# Patient Record
Sex: Female | Born: 1975 | Hispanic: No | Marital: Married | Smoking: Never smoker
Health system: Southern US, Community
[De-identification: ages and names within clinical notes are randomized; demographics above are authoritative.]

## PROBLEM LIST (undated history)

## (undated) DIAGNOSIS — E785 Hyperlipidemia, unspecified: Secondary | ICD-10-CM

## (undated) DIAGNOSIS — I10 Essential (primary) hypertension: Secondary | ICD-10-CM

## (undated) HISTORY — PX: TUBAL LIGATION: SHX77

---

## 2020-06-02 ENCOUNTER — Encounter (HOSPITAL_BASED_OUTPATIENT_CLINIC_OR_DEPARTMENT_OTHER): Payer: Self-pay | Admitting: *Deleted

## 2020-06-02 ENCOUNTER — Emergency Department (HOSPITAL_BASED_OUTPATIENT_CLINIC_OR_DEPARTMENT_OTHER): Payer: BC Managed Care – PPO

## 2020-06-02 ENCOUNTER — Emergency Department (HOSPITAL_BASED_OUTPATIENT_CLINIC_OR_DEPARTMENT_OTHER)
Admission: EM | Admit: 2020-06-02 | Discharge: 2020-06-02 | Disposition: A | Discharge status: Home / Self Care | Payer: BC Managed Care – PPO | Attending: Emergency Medicine | Admitting: Emergency Medicine

## 2020-06-02 ENCOUNTER — Other Ambulatory Visit: Payer: Self-pay

## 2020-06-02 DIAGNOSIS — R609 Edema, unspecified: Secondary | ICD-10-CM | POA: Insufficient documentation

## 2020-06-02 DIAGNOSIS — I10 Essential (primary) hypertension: Secondary | ICD-10-CM | POA: Diagnosis not present

## 2020-06-02 DIAGNOSIS — D259 Leiomyoma of uterus, unspecified: Secondary | ICD-10-CM | POA: Insufficient documentation

## 2020-06-02 DIAGNOSIS — R300 Dysuria: Secondary | ICD-10-CM | POA: Insufficient documentation

## 2020-06-02 DIAGNOSIS — R102 Pelvic and perineal pain: Secondary | ICD-10-CM

## 2020-06-02 HISTORY — DX: Essential (primary) hypertension: I10

## 2020-06-02 HISTORY — DX: Hyperlipidemia, unspecified: E78.5

## 2020-06-02 LAB — CBC WITH DIFFERENTIAL/PLATELET
Abs Immature Granulocytes: 0.01 10*3/uL (ref 0.00–0.07)
Basophils Absolute: 0 10*3/uL (ref 0.0–0.1)
Basophils Relative: 0 %
Eosinophils Absolute: 0.1 10*3/uL (ref 0.0–0.5)
Eosinophils Relative: 1 %
HCT: 43 % (ref 36.0–46.0)
Hemoglobin: 14.1 g/dL (ref 12.0–15.0)
Immature Granulocytes: 0 %
Lymphocytes Relative: 28 %
Lymphs Abs: 1.4 10*3/uL (ref 0.7–4.0)
MCH: 29.4 pg (ref 26.0–34.0)
MCHC: 32.8 g/dL (ref 30.0–36.0)
MCV: 89.8 fL (ref 80.0–100.0)
Monocytes Absolute: 0.5 10*3/uL (ref 0.1–1.0)
Monocytes Relative: 10 %
Neutro Abs: 2.9 10*3/uL (ref 1.7–7.7)
Neutrophils Relative %: 61 %
Platelets: 194 10*3/uL (ref 150–400)
RBC: 4.79 MIL/uL (ref 3.87–5.11)
RDW: 13.8 % (ref 11.5–15.5)
WBC: 4.8 10*3/uL (ref 4.0–10.5)
nRBC: 0 % (ref 0.0–0.2)

## 2020-06-02 LAB — URINALYSIS, ROUTINE W REFLEX MICROSCOPIC
Bilirubin Urine: NEGATIVE
Glucose, UA: NEGATIVE mg/dL
Hgb urine dipstick: NEGATIVE
Ketones, ur: NEGATIVE mg/dL
Leukocytes,Ua: NEGATIVE
Nitrite: NEGATIVE
Protein, ur: NEGATIVE mg/dL
Specific Gravity, Urine: 1.01 (ref 1.005–1.030)
pH: 6.5 (ref 5.0–8.0)

## 2020-06-02 LAB — COMPREHENSIVE METABOLIC PANEL
ALT: 25 U/L (ref 0–44)
AST: 24 U/L (ref 15–41)
Albumin: 4 g/dL (ref 3.5–5.0)
Alkaline Phosphatase: 51 U/L (ref 38–126)
Anion gap: 9 (ref 5–15)
BUN: 9 mg/dL (ref 6–20)
CO2: 25 mmol/L (ref 22–32)
Calcium: 9 mg/dL (ref 8.9–10.3)
Chloride: 103 mmol/L (ref 98–111)
Creatinine, Ser: 0.74 mg/dL (ref 0.44–1.00)
GFR, Estimated: >60 mL/min (ref >60)
Glucose, Bld: 77 mg/dL (ref 70–99)
Potassium: 3.4 mmol/L — ABNORMAL LOW (ref 3.5–5.1)
Sodium: 137 mmol/L (ref 135–145)
Total Bilirubin: 0.6 mg/dL (ref 0.3–1.2)
Total Protein: 7 g/dL (ref 6.5–8.1)

## 2020-06-02 LAB — WET PREP, GENITAL
Sperm: NONE SEEN
Trich, Wet Prep: NONE SEEN
WBC, Wet Prep HPF POC: NONE SEEN
Yeast Wet Prep HPF POC: NONE SEEN

## 2020-06-02 LAB — PREGNANCY, URINE: Preg Test, Ur: NEGATIVE

## 2020-06-02 LAB — BRAIN NATRIURETIC PEPTIDE: B Natriuretic Peptide: 11.6 pg/mL (ref 0.0–100.0)

## 2020-06-02 LAB — LIPASE, BLOOD: Lipase: 40 U/L (ref 11–51)

## 2020-06-02 MED ORDER — POTASSIUM CHLORIDE CRYS ER 20 MEQ PO TBCR
40.0000 meq | EXTENDED_RELEASE_TABLET | Freq: Once | ORAL | Status: AC
  Administered 2020-06-02: 40 meq via ORAL
  Filled 2020-06-02: qty 2

## 2020-06-02 NOTE — ED Notes (Signed)
Pt to US.

## 2020-06-02 NOTE — ED Provider Notes (Signed)
[facility] DEPART[facility] Provider Note   CSN: [id number] Arrival date & time: 06/02/20  1359     History Chief Complaint  Patient presents with  . Dysuria    Cindy Barrett is a 45 y.o. female with a past medical history significant for hyperlipidemia and hypertension presents to the ED due to multiple complaints of dysuria, pelvic pain, lower extremity edema.  Patient states dysuria and pelvic pain have been present for the past month.  She describes pain as a pressure and burning sensation that occasionally radiates to the upper portion of her abdomen.  Denies fever and chills.  Denies associated nausea, vomiting, diarrhea.  Patient has been evaluated by her PCP and has a pelvic and abdominal ultrasound scheduled on the 27th for further evaluation.  Patient is currently sexually active with her husband with no concern for STDs.  Denies increased vaginal discharge. She has had 3 cesarean sections, but no other abdominal operations. No treatment prior to arrival.  Aggravating or alleviating symptoms.  Patient also admits to bilateral lower extremity edema that has been present for few months.  Per chart review.  Patient had a recent echocardiogram which was noncontributory per PCP note. Peripheral edema unknown cause at this time; however, is still being worked up by PCP. Denies associated chest pain and shortness of breath.  Denies history of blood clots, recent surgeries, recent long immobilizations, and hormonal treatments.  History obtained from patient and past medical records. No interpreter used during encounter.      Past Medical History:  Diagnosis Date  . Hyperlipidemia   . Hypertension     There are no problems to display for this patient.   Past Surgical History:  Procedure Laterality Date  . CESAREAN SECTION    . TUBAL LIGATION       OB History   No obstetric history on file.     No family history on file.  Social History   Substance Use  Topics  . Alcohol use: Not Currently  . Drug use: Not Currently    Home Medications Prior to Admission medications   Not on File    Allergies    Escitalopram oxalate  Review of Systems   Review of Systems  Constitutional: Negative for chills and fever.  Respiratory: Negative for shortness of breath.   Cardiovascular: Positive for leg swelling (around ankles/feet). Negative for chest pain.  Gastrointestinal: Positive for abdominal pain. Negative for diarrhea, nausea and vomiting.  Genitourinary: Positive for dysuria and pelvic pain. Negative for vaginal discharge.  All other systems reviewed and are negative.   Physical Exam Updated Vital Signs BP (!) 115/48 (BP Location: Left Arm)   Pulse 61   Temp 98.5 F (36.9 C) (Oral)   Resp 20   Ht 5\' 4"  (1.626 m)   Wt 113.9 kg   LMP 04/29/2020   SpO2 100%   BMI 43.08 kg/m   Physical Exam Vitals and nursing note reviewed.  Constitutional:      General: She is not in acute distress.    Appearance: She is not ill-appearing.  HENT:     Head: Normocephalic.  Eyes:     Pupils: Pupils are equal, round, and reactive to light.  Cardiovascular:     Rate and Rhythm: Normal rate and regular rhythm.     Pulses: Normal pulses.     Heart sounds: Normal heart sounds. No murmur heard. No friction rub. No gallop.   Pulmonary:     Effort: Pulmonary effort  is normal.     Breath sounds: Normal breath sounds.  Abdominal:     General: Abdomen is flat. There is no distension.     Palpations: Abdomen is soft.     Tenderness: There is no abdominal tenderness. There is no guarding or rebound.     Comments: Mild suprapubic tenderness without rebound or guarding  Genitourinary:    Comments: Exam performed with chaperone in room.  Mild amount of white vaginal discharge.  No CMT.  No adnexal tenderness or masses. Musculoskeletal:        General: Normal range of motion.     Cervical back: Neck supple.  Skin:    General: Skin is warm and dry.      Comments: Edema to bilateral ankles and feet. No calf tenderness. Negative homan sign bilaterally.   Neurological:     General: No focal deficit present.     Mental Status: She is alert.  Psychiatric:        Mood and Affect: Mood normal.        Behavior: Behavior normal.     ED Results / Procedures / Treatments   Labs (all labs ordered are listed, but only abnormal results are displayed) Labs Reviewed  WET PREP, GENITAL - Abnormal; Notable for the following components:      Result Value   Clue Cells Wet Prep HPF POC PRESENT (*)    All other components within normal limits  COMPREHENSIVE METABOLIC PANEL - Abnormal; Notable for the following components:   Potassium 3.4 (*)    All other components within normal limits  URINALYSIS, ROUTINE W REFLEX MICROSCOPIC  PREGNANCY, URINE  CBC WITH DIFFERENTIAL/PLATELET  LIPASE, BLOOD  BRAIN NATRIURETIC PEPTIDE  GC/CHLAMYDIA PROBE AMP ([place]) NOT AT [facility]    EKG None  Radiology US PELVIC COMPLETE W TRANSVAGINAL AND TORSION R/O  Result Date: 06/02/2020 CLINICAL DATA:  Pelvic pain EXAM: TRANSABDOMINAL AND TRANSVAGINAL ULTRASOUND OF PELVIS DOPPLER ULTRASOUND OF OVARIES TECHNIQUE: Both transabdominal and transvaginal ultrasound examinations of the pelvis were performed. Transabdominal technique was performed for global imaging of the pelvis including uterus, ovaries, adnexal regions, and pelvic cul-de-sac. It was necessary to proceed with endovaginal exam following the transabdominal exam to visualize the uterus, endometrium, and ovaries. Color and duplex Doppler ultrasound was utilized to evaluate blood flow to the ovaries. COMPARISON:  Ultrasound pelvis 08/30/2015 FINDINGS: Measurements: 8.7 x 4.6 x 5.4 cm = volume: 111 mL. Two fibroids are noted within the posterior uterine body/fundus measuring 2.0 x 1.7 x 1.7 cm and 1.8 x 1.6 x 1.7 cm cm. Endometrium Thickness: 7 mm.  No focal abnormality visualized. Right ovary Measurements: 3.9 x 2.5 x  3.7 cm = volume: 18 mL. Normal appearance/no adnexal mass. Only visualized transabdominally. Left ovary Measurements: 3.3 x 1.8 x 2.7 cm = volume: 8 mL. Normal appearance/no adnexal mass. Only visualized transabdominally. Pulsed Doppler evaluation of both ovaries demonstrates normal low-resistance arterial and venous waveforms. Other findings No abnormal free fluid. IMPRESSION: Two small uterine fibroids. Otherwise unremarkable pelvic ultrasound. Electronically Signed   By: Miachel Roux M.D.   On: 06/02/2020 16:26    Procedures Procedures   Medications Ordered in ED Medications  potassium chloride SA (KLOR-CON) CR tablet 40 mEq (40 mEq Oral Given 06/02/20 1623)    ED Course  I have reviewed the triage vital signs and the nursing notes.  Pertinent labs & imaging results that were available during my care of the patient were reviewed by me and considered in my  medical decision making (see chart for details).  Clinical Course as of 06/02/20 1645  Fri Jun 02, 2020  1618 Clue Cells Wet Prep HPF POC(!): PRESENT [CA]  1618 B Natriuretic Peptide: 11.6 [CA]  1618 Potassium(!): 3.4 [CA]  1618 Preg Test, Ur: NEGATIVE [CA]    Clinical Course User Index [CA] Suzy Bouchard, PA-C   MDM Rules/Calculators/A&P                         45 year old female presents to the ED due to multiple complaints of dysuria, pelvic pain, and peripheral edema that been ongoing for a few months.  Patient has been worked up by PCP and has an abdominal and pelvic ultrasound scheduled on 5/27. Patient is afebrile and non-toxic appearing. Physical exam significant for mild suprapubic tenderness without rebound or guarding.  No tenderness at Mcburney's point. Negative murphy's sign. Low suspicion for acute abdomen. Routine labs ordered to check electrolytes and renal function given peripheral edema. BNP to rule out CHF. Pelvic ultrasound to rule out pelvic etiology.   Pelvic exam performed with chaperone in room.  Mild  amount of white discharge.  No CMT or adnexal tenderness/masses.  Doubt PID.  CBC unremarkable no leukocytosis and normal hemoglobin.  BMP normal.  Doubt CHF.  Lipase normal at 40.  CMP significant for hypokalemia at 3.4, but otherwise unremarkable.  Normal renal function.  Wet prep positive for clue cells.  Given patient is currently asymptomatic we will hold antibiotics at this time.  Pregnancy test negative.  UA unremarkable with no signs of infection. [place] personally reviewed which demonstrates: IMPRESSION:  Two small uterine fibroids. Otherwise unremarkable pelvic  ultrasound.   Suspect pelvic pressure related to fibroids. Patient has a schedule OBGYN appointment in 2 weeks. Instructed her to report to her scheduled appointment.  Patient instructed to take over-the-counter ibuprofen or Tylenol as needed for pain. Strict ED precautions discussed with patient. Patient states understanding and agrees to plan. Patient discharged home in no acute distress and stable vitals. Final Clinical Impression(s) / ED Diagnoses Final diagnoses:  Uterine leiomyoma, unspecified location  Dysuria    Rx / DC Orders ED Discharge Orders    None       Karie Kirks 06/02/20 1705    Isla Pence, MD 06/03/20 1137

## 2020-06-02 NOTE — ED Triage Notes (Signed)
C/o dysuria and pelvic pain x 1 month , sched for [place]  On 27th

## 2020-06-02 NOTE — Discharge Instructions (Addendum)
It was a pleasure taking care of you today. All of your labs were reassuring today. Your ultrasound showed 2 fibroids which are most likely causing your pelvic pain/pressure. Please follow-up with OBGYN for further evaluation.  You may take over-the-counter ibuprofen or Tylenol as needed for pain.  Still go to your ultrasound scheduled on the 27th for you abdominal ultrasound, but you do not need to repeat your pelvic ultrasound. Please follow-up with PCP if symptoms do not improved within the next week.  Return to the ER for new or worsening symptoms.

## 2020-06-05 LAB — GC/CHLAMYDIA PROBE AMP (~~LOC~~) NOT AT ARMC
Chlamydia: NEGATIVE
Comment: NEGATIVE
Comment: NORMAL
Neisseria Gonorrhea: NEGATIVE

## 2020-12-28 ENCOUNTER — Emergency Department (HOSPITAL_BASED_OUTPATIENT_CLINIC_OR_DEPARTMENT_OTHER): Payer: BC Managed Care – PPO

## 2020-12-28 ENCOUNTER — Emergency Department (HOSPITAL_BASED_OUTPATIENT_CLINIC_OR_DEPARTMENT_OTHER)
Admission: EM | Admit: 2020-12-28 | Discharge: 2020-12-28 | Disposition: A | Discharge status: Home / Self Care | Payer: BC Managed Care – PPO | Attending: Emergency Medicine | Admitting: Emergency Medicine

## 2020-12-28 ENCOUNTER — Other Ambulatory Visit: Payer: Self-pay

## 2020-12-28 ENCOUNTER — Encounter (HOSPITAL_BASED_OUTPATIENT_CLINIC_OR_DEPARTMENT_OTHER): Payer: Self-pay | Admitting: Urology

## 2020-12-28 DIAGNOSIS — M549 Dorsalgia, unspecified: Secondary | ICD-10-CM | POA: Insufficient documentation

## 2020-12-28 DIAGNOSIS — R0602 Shortness of breath: Secondary | ICD-10-CM | POA: Insufficient documentation

## 2020-12-28 DIAGNOSIS — R0789 Other chest pain: Secondary | ICD-10-CM | POA: Insufficient documentation

## 2020-12-28 DIAGNOSIS — R042 Hemoptysis: Secondary | ICD-10-CM | POA: Diagnosis not present

## 2020-12-28 DIAGNOSIS — R079 Chest pain, unspecified: Secondary | ICD-10-CM

## 2020-12-28 DIAGNOSIS — I10 Essential (primary) hypertension: Secondary | ICD-10-CM | POA: Diagnosis not present

## 2020-12-28 LAB — BASIC METABOLIC PANEL
Anion gap: 8 (ref 5–15)
BUN: 11 mg/dL (ref 6–20)
CO2: 25 mmol/L (ref 22–32)
Calcium: 9.3 mg/dL (ref 8.9–10.3)
Chloride: 103 mmol/L (ref 98–111)
Creatinine, Ser: 0.58 mg/dL (ref 0.44–1.00)
GFR, Estimated: >60 mL/min (ref >60)
Glucose, Bld: 103 mg/dL — ABNORMAL HIGH (ref 70–99)
Potassium: 3.3 mmol/L — ABNORMAL LOW (ref 3.5–5.1)
Sodium: 136 mmol/L (ref 135–145)

## 2020-12-28 LAB — CBC
HCT: 38.5 % (ref 36.0–46.0)
Hemoglobin: 13.5 g/dL (ref 12.0–15.0)
MCH: 30.4 pg (ref 26.0–34.0)
MCHC: 35.1 g/dL (ref 30.0–36.0)
MCV: 86.7 fL (ref 80.0–100.0)
Platelets: 207 10*3/uL (ref 150–400)
RBC: 4.44 MIL/uL (ref 3.87–5.11)
RDW: 13.4 % (ref 11.5–15.5)
WBC: 4.1 10*3/uL (ref 4.0–10.5)
nRBC: 0 % (ref 0.0–0.2)

## 2020-12-28 LAB — TROPONIN I (HIGH SENSITIVITY)
Troponin I (High Sensitivity): 4 ng/L (ref <18)
Troponin I (High Sensitivity): 5 ng/L (ref <18)

## 2020-12-28 LAB — PREGNANCY, URINE: Preg Test, Ur: NEGATIVE

## 2020-12-28 MED ORDER — MORPHINE SULFATE (PF) 2 MG/ML IV SOLN
2.0000 mg | Freq: Once | INTRAVENOUS | Status: AC
  Administered 2020-12-28: 2 mg via INTRAVENOUS
  Filled 2020-12-28: qty 1

## 2020-12-28 MED ORDER — IOHEXOL 350 MG/ML SOLN
100.0000 mL | Freq: Once | INTRAVENOUS | Status: AC | PRN
  Administered 2020-12-28: 100 mL via INTRAVENOUS

## 2020-12-28 NOTE — Progress Notes (Signed)
RN asked RT to do arterial stick for blood. MD aware. Blood obtained and sent to lab

## 2020-12-28 NOTE — ED Triage Notes (Signed)
Pt states chest pain x 3 hours, states cough that started with blood in sputum at same time as pain.  Denies any SOB.

## 2020-12-28 NOTE — ED Notes (Signed)
ED Provider at bedside. 

## 2020-12-28 NOTE — ED Notes (Signed)
Patient discharged to home.  All discharge instructions reviewed.  Patient verbalized understanding via teachback method.  VS WDL.  Respirations even and unlabored.  Ambulatory out of ED.   °

## 2020-12-28 NOTE — ED Notes (Signed)
Attempt at IV with no result, another nurse to try

## 2020-12-28 NOTE — ED Notes (Signed)
Patient transported to CT 

## 2020-12-28 NOTE — ED Provider Notes (Signed)
[facility] Provider Note   CSN: [id number] Arrival date & time: 12/28/20  1510     History Chief Complaint  Patient presents with   Chest Pain   Hemoptysis    Teleshia Lemere is a 45 y.o. 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  Patient endorses coughing up blood into tissue.  Patient reports small amount of blood, no gross hemoptysis.  Patient denies history of heartburn, acid reflux.  Patient denies history of ACS, diabetes, family history of diabetes, tobacco use.  Patient rates her pain 9/10 at this time.  Patient denies recent travel, history of blood clots in legs or lungs.   Chest Pain Associated symptoms: back pain and shortness of breath       Past Medical History:  Diagnosis Date   Hyperlipidemia    Hypertension     There are no problems to display for this patient.   Past Surgical History:  Procedure Laterality Date   CESAREAN SECTION     TUBAL LIGATION       OB History   No obstetric history on file.     History reviewed. No pertinent family history.  Social History   Tobacco Use   Smoking status: Never    Passive exposure: Never  Vaping Use   Vaping Use: Never used  Substance Use Topics   Alcohol use: Not Currently   Drug use: Not Currently    Home Medications Prior to Admission medications   Not on File    Allergies    Escitalopram oxalate  Review of Systems   Review of Systems  Respiratory:  Positive for shortness of breath.   Cardiovascular:  Positive for chest pain.  Musculoskeletal:  Positive for back pain.  All other systems reviewed and are negative.  Physical Exam Updated Vital Signs BP 128/67    Pulse (!) 57    Temp 98.7 F (37.1 C) (Oral)     Resp 18    Ht 5\' 4"  (1.626 m)    Wt 108.9 kg    LMP 12/26/2020 (Exact Date)    SpO2 99%    BMI 41.20 kg/m   Physical Exam Vitals and nursing note reviewed.  Constitutional:      General: She is not in acute distress.    Appearance: Normal appearance.  HENT:     Head: Normocephalic and atraumatic.  Eyes:     General:        Right eye: No discharge.        Left eye: No discharge.  Cardiovascular:     Rate and Rhythm: Normal rate and regular rhythm.     Heart sounds: No murmur heard.   No friction rub. No gallop.  Pulmonary:     Effort: Pulmonary effort is normal.     Breath sounds: Normal breath sounds.  Chest:     Comments: There is minimal chest wall tenderness. Abdominal:     General: Bowel sounds are normal.     Palpations: Abdomen is soft.     Comments: There is minimal diffuse tenderness to palpation in the epigastric region.  Skin:    General: Skin is warm and dry.     Capillary Refill: Capillary refill takes less than 2 seconds.  Neurological:  Mental Status: She is alert and oriented to person, place, and time.  Psychiatric:        Mood and Affect: Mood normal.        Behavior: Behavior normal.    ED Results / Procedures / Treatments   Labs (all labs ordered are listed, but only abnormal results are displayed) Labs Reviewed  BASIC METABOLIC PANEL - Abnormal; Notable for the following components:      Result Value   Potassium 3.3 (*)    Glucose, Bld 103 (*)    All other components within normal limits  CBC  PREGNANCY, URINE  TROPONIN I (HIGH SENSITIVITY)  TROPONIN I (HIGH SENSITIVITY)    EKG None  Radiology DG Chest 2 View  Result Date: 12/28/2020 CLINICAL DATA:  Chest pain, hemoptysis EXAM: CHEST - 2 VIEW COMPARISON:  04/26/2015 FINDINGS: The heart size and mediastinal contours are within normal limits. Both lungs are clear. The visualized skeletal structures are unremarkable. IMPRESSION: Normal study. Electronically Signed   By: Rolm Baptise  M.D.   On: 12/28/2020 17:45   CT Angio Chest PE W and/or Wo Contrast  Result Date: 12/28/2020 CLINICAL DATA:  Concern for pulmonary embolism. EXAM: CT ANGIOGRAPHY CHEST WITH CONTRAST TECHNIQUE: Multidetector CT imaging of the chest was performed using the standard protocol during bolus administration of intravenous contrast. Multiplanar CT image reconstructions and MIPs were obtained to evaluate the vascular anatomy. CONTRAST:  146mL OMNIPAQUE IOHEXOL 350 MG/ML SOLN COMPARISON:  Chest CT dated 08/11/2005 and radiograph dated 12/28/2020. FINDINGS: Cardiovascular: No cardiomegaly or pericardial effusion. The thoracic aorta is unremarkable. The origins of the great vessels of the aortic arch appear patent. No pulmonary artery embolus identified. Mediastinum/Nodes: No hilar or mediastinal adenopathy. Mildly enlarged left axillary lymph node measures 14 mm in short axis. The esophagus is grossly unremarkable. No mediastinal fluid collection. Lungs/Pleura: No focal consolidation, pleural effusion, or pneumothorax. The central airways are patent. Upper Abdomen: Fatty liver. Musculoskeletal: No acute osseous pathology. Review of the MIP images confirms the above findings. IMPRESSION: 1. No acute intrathoracic pathology. No CT evidence of pulmonary embolism. 2. Mildly enlarged left axillary lymph node, nonspecific. 3. Fatty liver. Electronically Signed   By: Anner Crete M.D.   On: 12/28/2020 21:15    Procedures Procedures   Medications Ordered in ED Medications  morphine 2 MG/ML injection 2 mg (2 mg Intravenous Given 12/28/20 1631)  iohexol (OMNIPAQUE) 350 MG/ML injection 100 mL (100 mLs Intravenous Contrast Given 12/28/20 1718)    ED Course  I have reviewed the triage vital signs and the nursing notes.  Pertinent labs & imaging results that were available during my care of the patient were reviewed by me and considered in my medical decision making (see chart for details).    MDM  Rules/Calculators/A&P                         I discussed this case with my attending physician who cosigned this note including patient's presenting symptoms, physical exam, and planned diagnostics and interventions. Attending physician stated agreement with plan or made changes to plan which were implemented.   Attending physician assessed patient at bedside.  Given the large differential diagnosis for Rasheen Schewe, the decision making in this case is of high complexity.  After evaluating all of the data points in this case, the presentation of Marvelle Span is NOT consistent with Acute Coronary Syndrome (ACS) and/or myocardial ischemia, pulmonary embolism, aortic dissection; Borhaave's, significant arrythmia, pneumothorax,  cardiac tamponade, or other emergent cardiopulmonary condition.  Further, the presentation of Lynsey Ange is NOT consistent with pericarditis, myocarditis, cholecystitis, pancreatitis, mediastinitis, endocarditis, new valvular disease.  Additionally, the presentation of Shanine Kreiger is NOT consistent with flail chest, cardiac contusion, ARDS, or significant intra-thoracic or intra-abdominal bleeding.  Moreover, this presentation is NOT consistent with pneumonia, sepsis, or pyelonephritis.  The patient has a presentation with report of hemoptysis, as well as radiation of pain to the back is concerning for pulmonary embolism versus aortic dissection versus ACS.  Given this concern will perform CTPE study.  Patient with nonischemic EKG, unremarkable CBC, BMP.  Mild hypokalemia, mild hyperglycemia.  Patient with negative troponin x2.  PE study without any evidence of aortic dissection, PE.  Patient with improvement of pain with morphine x1, as well as watchful waiting.  Discussed there is a broad differential for non-ACS, none lung disease related chest pain including musculoskeletal pain, reflux, versus other.  Encourage patient to follow-up with her primary  care doctor, contact cardiologist to discuss further evaluation for stress testing especially if she continues to have intermittent chest pain.  Encouraged follow-up, and return to the ED if she has worsening chest pain, shortness of breath.  Strict return and follow-up precautions have been given by me personally or by detailed written instruction given verbally by nursing staff using the teach back method to the patient/family/caregiver(s).  Data Reviewed/Counseling: I have reviewed the patient's vital signs, nursing notes, and other relevant tests/information. I had a detailed discussion regarding the historical points, exam findings, and any diagnostic results supporting the discharge diagnosis. I also discussed the need for outpatient follow-up and the need to return to the ED if symptoms worsen or if there are any questions or concerns that arise at home.   Final Clinical Impression(s) / ED Diagnoses Final diagnoses:  Chest pain, unspecified type    Rx / DC Orders ED Discharge Orders     None        Anselmo Pickler, PA-C 12/28/20 2319    Orpah Greek, MD 12/31/20 2329

## 2020-12-28 NOTE — Discharge Instructions (Signed)
As we discussed your work-up today was reassuring, there is no evidence of heart attack, blood clot in your lungs, pneumonia, or other abnormality in your chest.  It is difficult to say what is causing your feeling of chest pain, sometimes can be related to acid reflux, or pain in the musculature of the chest.  I do recommend that you follow-up with your primary care, and considering speaking to a cardiologist to see if they want to do some stress testing to further assess your heart function at this time.  Please use Tylenol or ibuprofen for pain.  You may use 600 mg ibuprofen every 6 hours or 1000 mg of Tylenol every 6 hours.  You may choose to alternate between the 2.  This would be most effective.  Not to exceed 4 g of Tylenol within 24 hours.  Not to exceed 3200 mg ibuprofen 24 hours.  If your symptoms worsen including worsening chest pain, shortness of breath please return to the emergency department for further evaluation.

## 2021-10-28 IMAGING — US US PELVIS COMPLETE TRANSABD/TRANSVAG W DUPLEX
1 series · 13 of 25 positions shown · non-contrast
Comparison: Ultrasound pelvis 08/30/2015

CLINICAL DATA: Pelvic pain

EXAM:
TRANSABDOMINAL AND TRANSVAGINAL ULTRASOUND OF PELVIS
DOPPLER ULTRASOUND OF OVARIES
TECHNIQUE: Both transabdominal and transvaginal ultrasound examinations of the
pelvis were performed. Transabdominal technique was performed for
global imaging of the pelvis including uterus, ovaries, adnexal
regions, and pelvic cul-de-sac.
It was necessary to proceed with endovaginal exam following the
transabdominal exam to visualize the uterus, endometrium, and
ovaries. Color and duplex Doppler ultrasound was utilized to
evaluate blood flow to the ovaries.

[Series 1: us pelvis complete transabd/transvag w duplex · 13 of 79 slices shown]
[im 1/79]
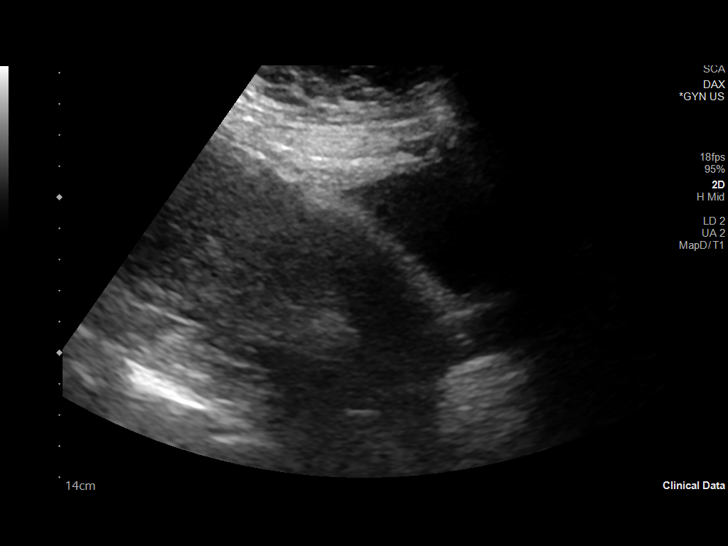
[im 7/79]
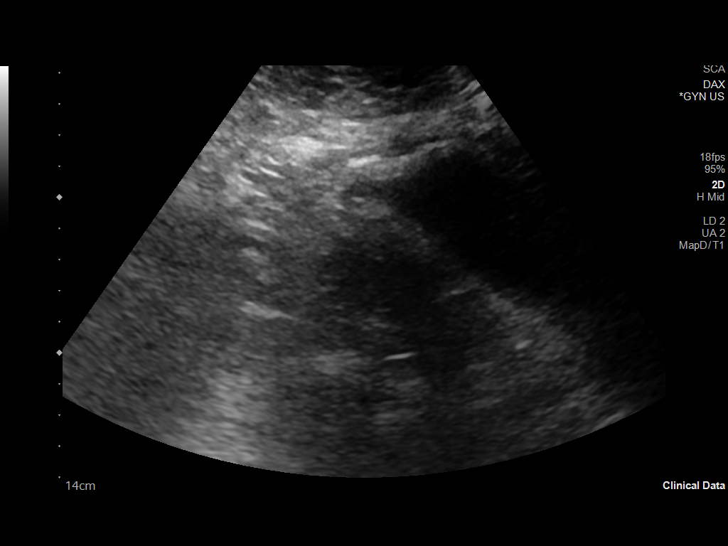
[im 14/79]
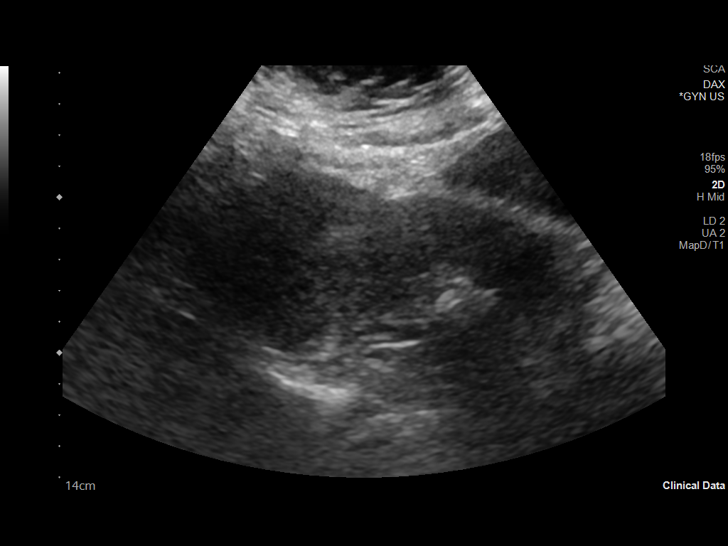
[im 20/79]
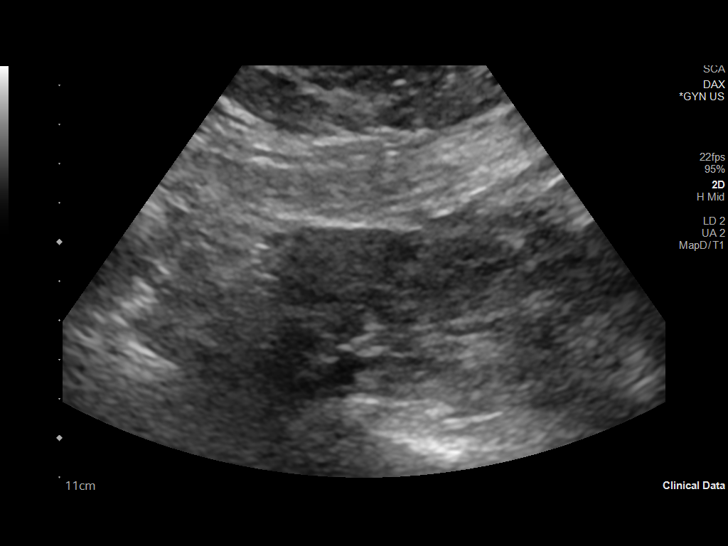
[im 27/79]
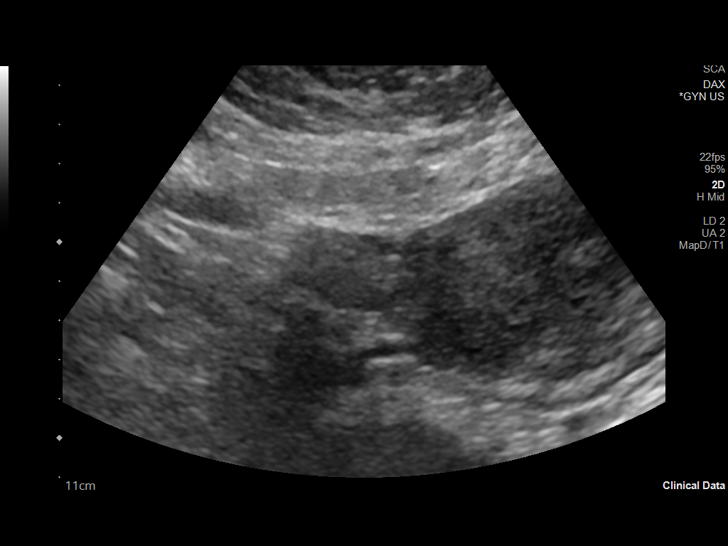
[im 33/79]
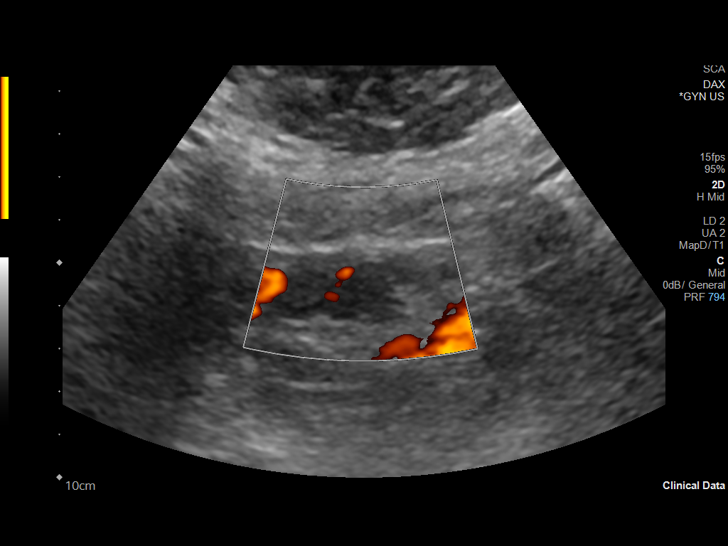
[im 40/79]
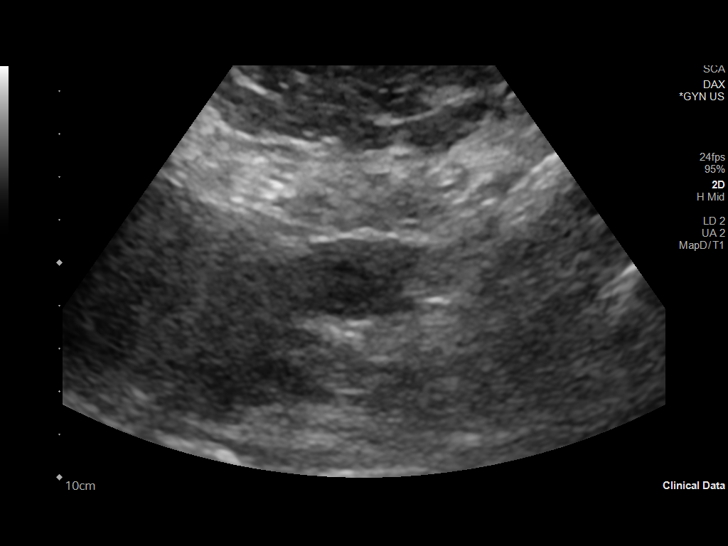
[im 46/79]
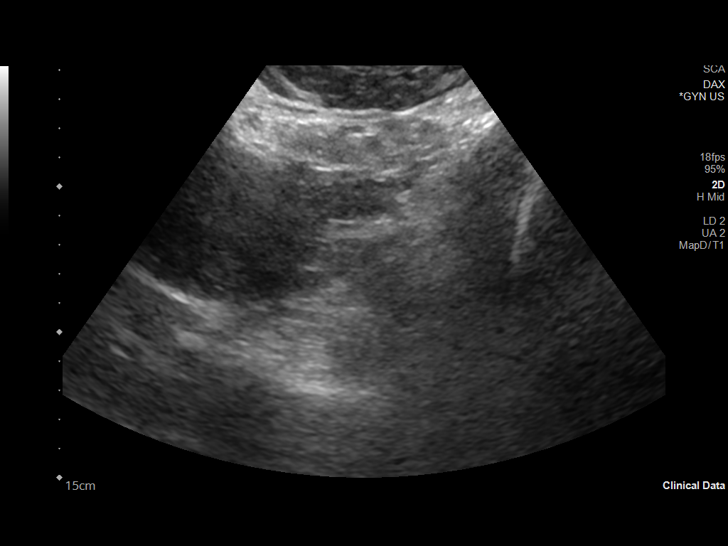
[im 53/79]
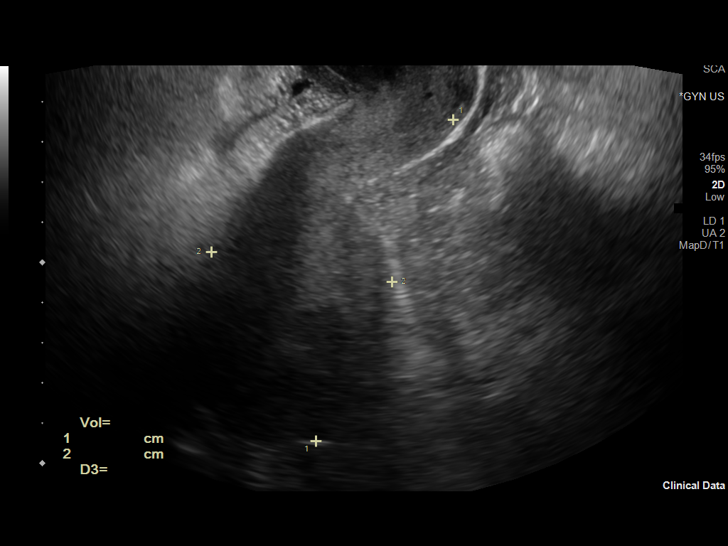
[im 59/79]
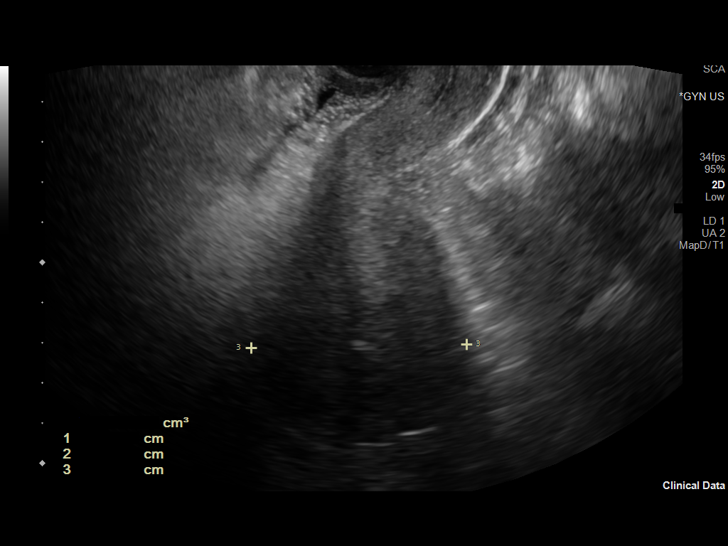
[im 66/79]
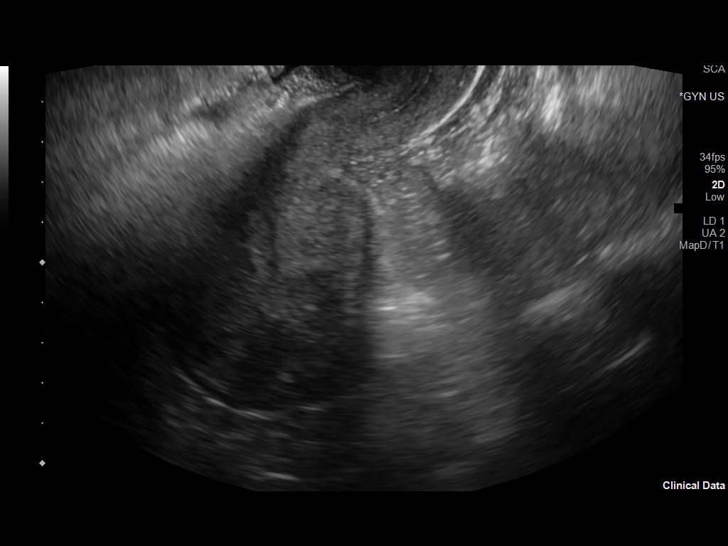
[im 72/79]
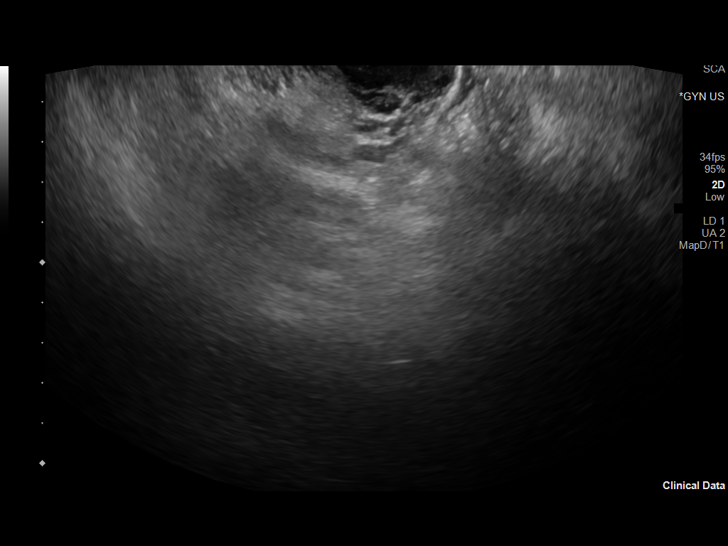
[im 79/79]
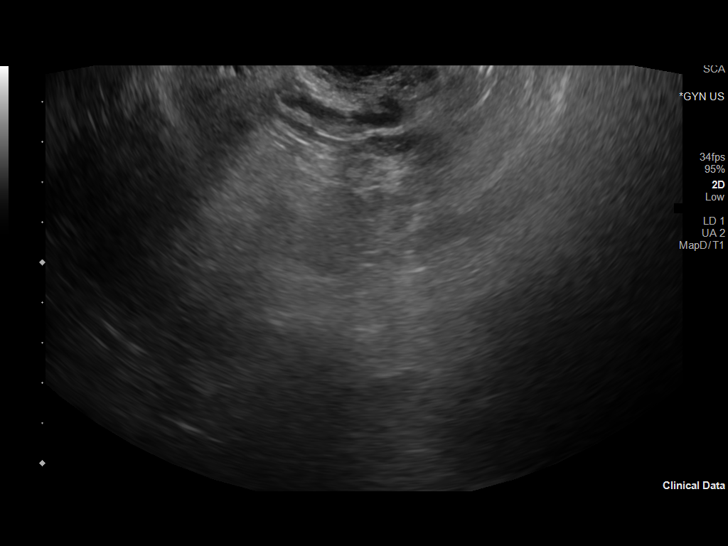

[13 of 25 positions shown; findings below may reference images not displayed]

FINDINGS: Measurements: 8.7 x 4.6 x 5.4 cm = volume: 111 mL. Two fibroids are
noted within the posterior uterine body/fundus measuring 2.0 x 1.7 x
1.7 cm and 1.8 x 1.6 x 1.7 cm cm.

Endometrium

Thickness: 7 mm.  No focal abnormality visualized.

Right ovary

Measurements: 3.9 x 2.5 x 3.7 cm = volume: 18 mL. Normal
appearance/no adnexal mass. Only visualized transabdominally.

Left ovary

Measurements: 3.3 x 1.8 x 2.7 cm = volume: 8 mL. Normal
appearance/no adnexal mass. Only visualized transabdominally.

Pulsed Doppler evaluation of both ovaries demonstrates normal
low-resistance arterial and venous waveforms.

Other findings

No abnormal free fluid.
IMPRESSION: Two small uterine fibroids. Otherwise unremarkable pelvic
ultrasound.

## 2022-05-25 IMAGING — DX DG CHEST 2V
2 series · 2 of 2 positions shown · non-contrast
Comparison: 04/26/2015

CLINICAL DATA: Chest pain, hemoptysis

EXAM:
CHEST - 2 VIEW

[chest pa]
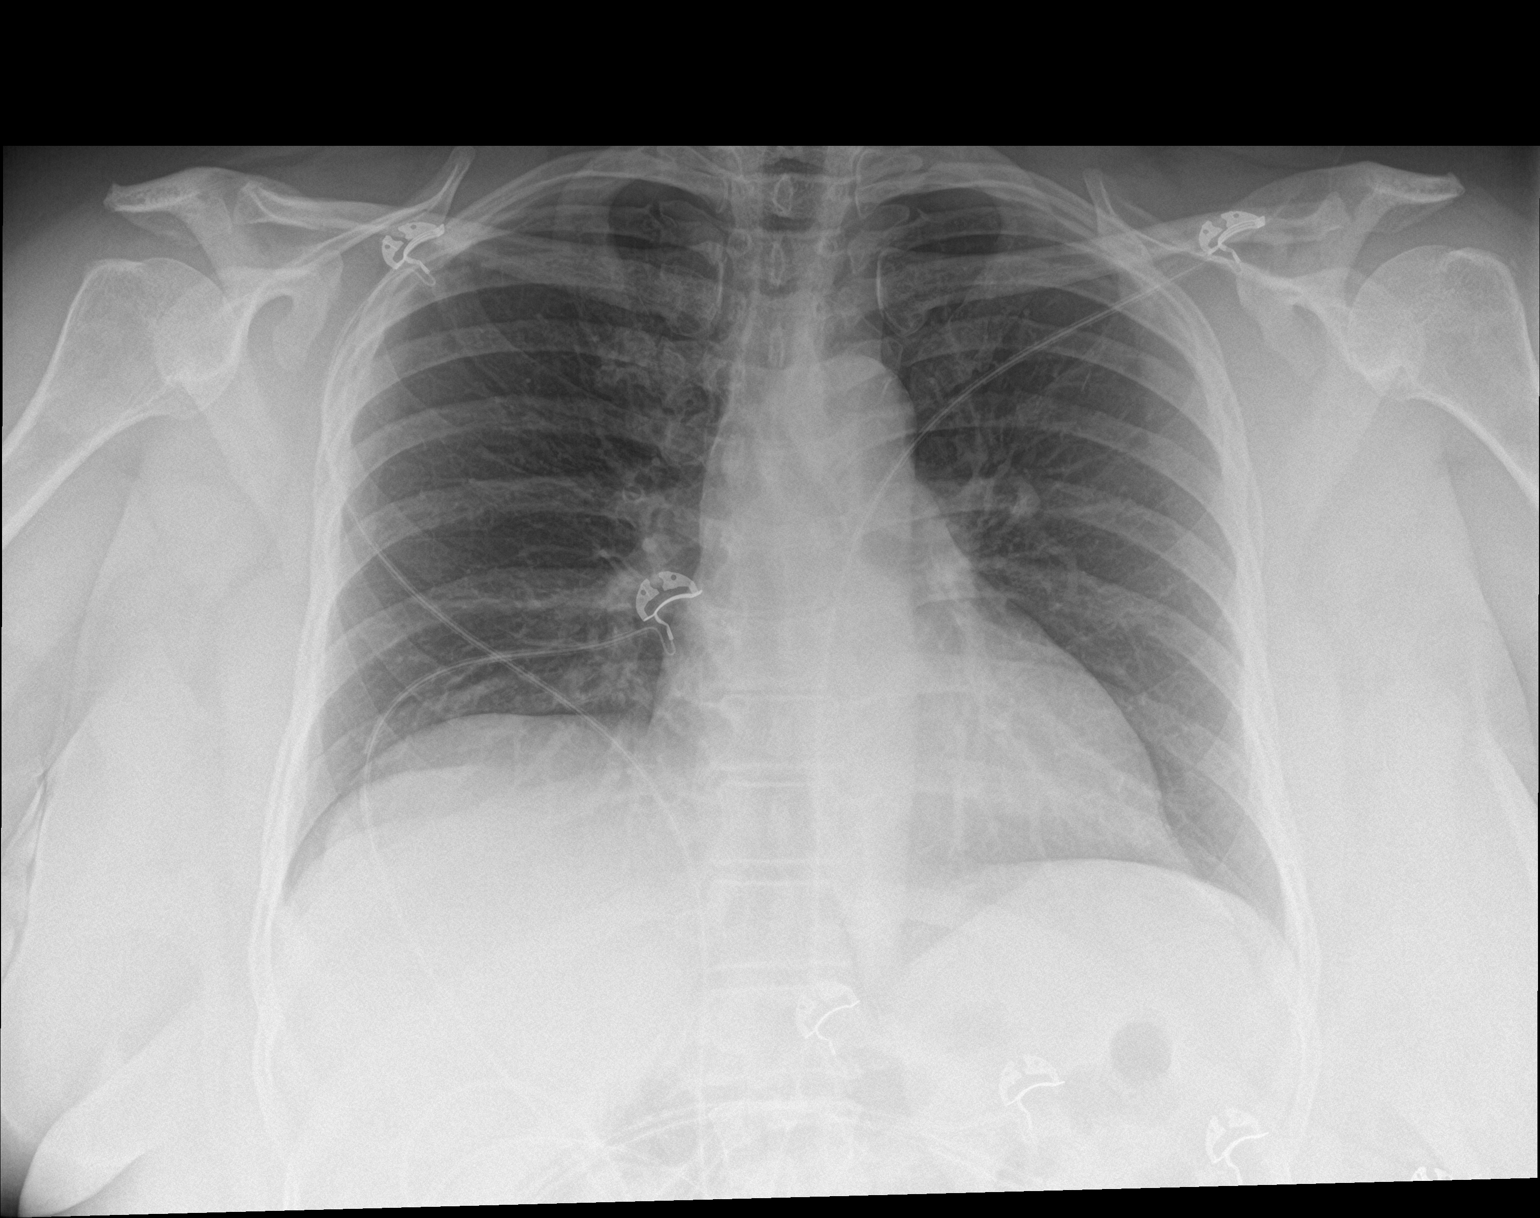

[chest lat]
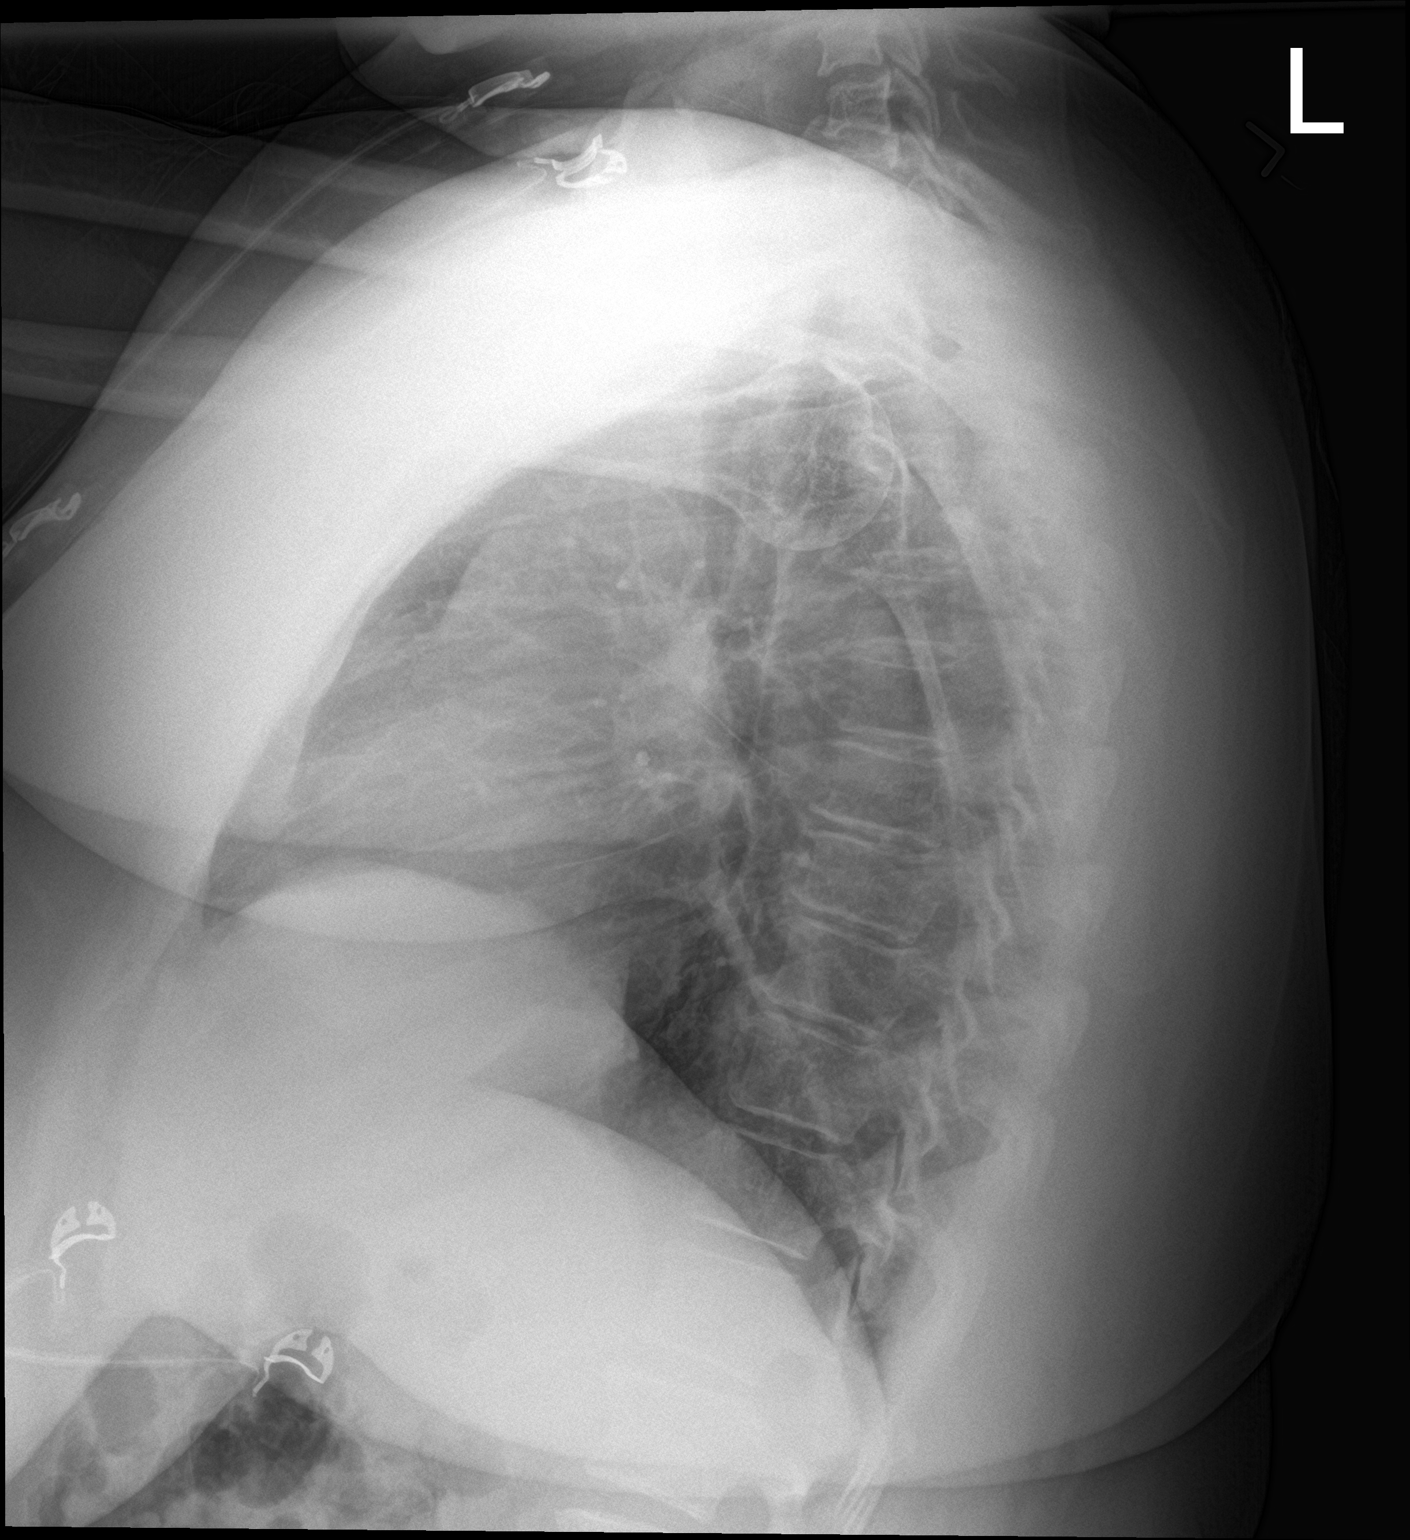

[2 of 2 positions shown; findings below may reference images not displayed]

FINDINGS: The heart size and mediastinal contours are within normal limits.
Both lungs are clear. The visualized skeletal structures are
unremarkable.
IMPRESSION: Normal study.
